# Patient Record
Sex: Female | Born: 1975 | Race: White | Hispanic: No | Smoking: Current every day smoker
Health system: Southern US, Community
[De-identification: ages and names within clinical notes are randomized; demographics above are authoritative.]

## PROBLEM LIST (undated history)

## (undated) DIAGNOSIS — M542 Cervicalgia: Secondary | ICD-10-CM

## (undated) DIAGNOSIS — J40 Bronchitis, not specified as acute or chronic: Secondary | ICD-10-CM

## (undated) DIAGNOSIS — J45909 Unspecified asthma, uncomplicated: Secondary | ICD-10-CM

## (undated) DIAGNOSIS — M549 Dorsalgia, unspecified: Secondary | ICD-10-CM

## (undated) DIAGNOSIS — G8929 Other chronic pain: Secondary | ICD-10-CM

## (undated) DIAGNOSIS — S92919A Unspecified fracture of unspecified toe(s), initial encounter for closed fracture: Secondary | ICD-10-CM

## (undated) DIAGNOSIS — J189 Pneumonia, unspecified organism: Secondary | ICD-10-CM

## (undated) HISTORY — PX: DILATION AND CURETTAGE, DIAGNOSTIC / THERAPEUTIC: SUR384

## (undated) HISTORY — PX: TUBAL LIGATION: SHX77

---

## 2011-12-26 ENCOUNTER — Encounter (HOSPITAL_BASED_OUTPATIENT_CLINIC_OR_DEPARTMENT_OTHER): Payer: Self-pay | Admitting: *Deleted

## 2011-12-26 ENCOUNTER — Emergency Department (HOSPITAL_BASED_OUTPATIENT_CLINIC_OR_DEPARTMENT_OTHER)
Admission: EM | Admit: 2011-12-26 | Discharge: 2011-12-26 | Disposition: A | Discharge status: Home / Self Care | Payer: Medicare Other | Attending: Emergency Medicine | Admitting: Emergency Medicine

## 2011-12-26 DIAGNOSIS — J45909 Unspecified asthma, uncomplicated: Secondary | ICD-10-CM | POA: Insufficient documentation

## 2011-12-26 DIAGNOSIS — G8929 Other chronic pain: Secondary | ICD-10-CM

## 2011-12-26 DIAGNOSIS — F17200 Nicotine dependence, unspecified, uncomplicated: Secondary | ICD-10-CM | POA: Insufficient documentation

## 2011-12-26 DIAGNOSIS — M542 Cervicalgia: Secondary | ICD-10-CM | POA: Insufficient documentation

## 2011-12-26 DIAGNOSIS — M545 Low back pain, unspecified: Secondary | ICD-10-CM | POA: Insufficient documentation

## 2011-12-26 HISTORY — DX: Other chronic pain: G89.29

## 2011-12-26 HISTORY — DX: Dorsalgia, unspecified: M54.9

## 2011-12-26 HISTORY — DX: Cervicalgia: M54.2

## 2011-12-26 MED ORDER — HYDROCODONE-ACETAMINOPHEN 5-500 MG PO TABS: 1.0000 | ORAL_TABLET | Freq: Four times a day (QID) | ORAL | Status: DC | PRN
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
## 2011-12-26 NOTE — Discharge Instructions (Signed)
Chronic Back Pain When back pain lasts longer than 3 months, it is called chronic back pain.This pain can be frustrating, but the cause of the pain is rarely dangerous.People with chronic back pain often go through certain periods that are more intense (flare-ups). CAUSES Chronic back pain can be caused by wear and tear (degeneration) on different structures in your back. These structures may include bones, ligaments, or discs. This degeneration may result in more pressure being placed on the nerves that travel to your legs and feet. This can lead to pain traveling from the low back down the back of the legs. When pain lasts longer than 3 months, it is not unusual for people to experience anxiety or depression. Anxiety and depression can also contribute to low back pain. TREATMENT  Establish a regular exercise plan. This is critical to improving your functional level.   Have a self-management plan for when you flare-up. Flare-ups rarely require a medical visit. Regular exercise will help reduce the intensity and frequency of your flare-ups.   Manage how you feel about your back pain and the rest of your life. Anxiety, depression, and feeling that you cannot alter your back pain have been shown to make back pain more intense and debilitating.   Medicines should never be your only treatment. They should be used along with other treatments to help you return to a more active lifestyle.   Procedures such as injections or surgery may be helpful but are rarely necessary. You may be able to get the same results with physical therapy or chiropractic care.  HOME CARE INSTRUCTIONS  Avoid bending, heavy lifting, prolonged sitting, and activities which make the problem worse.   Continue normal activity as much as possible.   Take brief periods of rest throughout the day to reduce your pain during flare-ups.   Follow your back exercise rehabilitation program. This can help reduce symptoms and prevent  more pain.   Only take over-the-counter or prescription medicines as directed by your caregiver. Muscle relaxants are sometimes prescribed. Narcotic pain medicine is discouraged for long-term pain, since addiction is a possible outcome.   If you smoke, quit.   Eat healthy foods and maintain a recommended body weight.  SEEK IMMEDIATE MEDICAL CARE IF:   You have weakness or numbness in one of your legs or feet.   You have trouble controlling your bladder or bowels.   You develop nausea, vomiting, abdominal pain, shortness of breath, or fainting.  Document Released: 11/01/2004 Document Revised: 09/13/2011 Document Reviewed: 09/08/2011 [employer] Patient Information 2012 [employer]. 
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
## 2011-12-27 NOTE — ED Provider Notes (Signed)
Medical screening examination/treatment/procedure(s) were performed by non-physician practitioner and as supervising physician I was immediately available for consultation/collaboration.    Nelia Shi, MD 12/27/11 [phone]

## 2012-01-03 ENCOUNTER — Encounter (HOSPITAL_COMMUNITY): Payer: Self-pay

## 2012-01-03 ENCOUNTER — Emergency Department (HOSPITAL_COMMUNITY): Payer: Medicare Other

## 2012-01-03 ENCOUNTER — Emergency Department (HOSPITAL_COMMUNITY)
Admission: EM | Admit: 2012-01-03 | Discharge: 2012-01-04 | Disposition: A | Discharge status: Home / Self Care | Payer: Medicare Other | Attending: Emergency Medicine | Admitting: Emergency Medicine

## 2012-01-03 DIAGNOSIS — R42 Dizziness and giddiness: Secondary | ICD-10-CM | POA: Insufficient documentation

## 2012-01-03 DIAGNOSIS — R29898 Other symptoms and signs involving the musculoskeletal system: Secondary | ICD-10-CM | POA: Insufficient documentation

## 2012-01-03 DIAGNOSIS — S0990XA Unspecified injury of head, initial encounter: Secondary | ICD-10-CM

## 2012-01-03 DIAGNOSIS — F17200 Nicotine dependence, unspecified, uncomplicated: Secondary | ICD-10-CM | POA: Insufficient documentation

## 2012-01-03 DIAGNOSIS — M79609 Pain in unspecified limb: Secondary | ICD-10-CM | POA: Insufficient documentation

## 2012-01-03 DIAGNOSIS — R110 Nausea: Secondary | ICD-10-CM | POA: Insufficient documentation

## 2012-01-03 DIAGNOSIS — W1809XA Striking against other object with subsequent fall, initial encounter: Secondary | ICD-10-CM | POA: Insufficient documentation

## 2012-01-03 DIAGNOSIS — R51 Headache: Secondary | ICD-10-CM | POA: Insufficient documentation

## 2012-01-03 DIAGNOSIS — M542 Cervicalgia: Secondary | ICD-10-CM

## 2012-01-03 DIAGNOSIS — J45909 Unspecified asthma, uncomplicated: Secondary | ICD-10-CM | POA: Insufficient documentation

## 2012-01-03 DIAGNOSIS — G8929 Other chronic pain: Secondary | ICD-10-CM | POA: Insufficient documentation

## 2012-01-03 DIAGNOSIS — M25519 Pain in unspecified shoulder: Secondary | ICD-10-CM | POA: Insufficient documentation

## 2012-01-03 DIAGNOSIS — M549 Dorsalgia, unspecified: Secondary | ICD-10-CM | POA: Insufficient documentation

## 2012-01-03 LAB — DIFFERENTIAL
Basophils Relative: 0 % (ref 0–1)
Lymphocytes Relative: 51 % — ABNORMAL HIGH (ref 12–46)
Lymphs Abs: 3.2 10*3/uL (ref 0.7–4.0)
Monocytes Relative: 5 % (ref 3–12)
Neutro Abs: 2.5 10*3/uL (ref 1.7–7.7)
Neutrophils Relative %: 39 % — ABNORMAL LOW (ref 43–77)

## 2012-01-03 LAB — URINALYSIS, ROUTINE W REFLEX MICROSCOPIC
Bilirubin Urine: NEGATIVE
Glucose, UA: NEGATIVE mg/dL
Nitrite: NEGATIVE
Specific Gravity, Urine: 1.014 (ref 1.005–1.030)
pH: 7 (ref 5.0–8.0)

## 2012-01-03 LAB — BASIC METABOLIC PANEL
BUN: 7 mg/dL (ref 6–23)
CO2: 29 mEq/L (ref 19–32)
Chloride: 105 mEq/L (ref 96–112)
GFR calc Af Amer: 73 mL/min — ABNORMAL LOW (ref >90)
Glucose, Bld: 87 mg/dL (ref 70–99)
Potassium: 3.6 mEq/L (ref 3.5–5.1)

## 2012-01-03 LAB — CBC
HCT: 36.7 % (ref 36.0–46.0)
Hemoglobin: 11.7 g/dL — ABNORMAL LOW (ref 12.0–15.0)
RBC: 4.66 MIL/uL (ref 3.87–5.11)

## 2012-01-03 MED ORDER — OXYCODONE-ACETAMINOPHEN 5-325 MG PO TABS: 1.0000 | ORAL_TABLET | Freq: Once | ORAL | Status: AC

## 2012-01-03 MED ORDER — OXYCODONE-ACETAMINOPHEN 5-325 MG PO TABS
1.0000 | ORAL_TABLET | Freq: Once | ORAL | Status: AC
  Administered 2012-01-03: 1 via ORAL
  Filled 2012-01-03: qty 1

## 2012-01-03 MED ORDER — ONDANSETRON HCL 4 MG PO TABS: 4.0000 mg | ORAL_TABLET | Freq: Four times a day (QID) | ORAL | Status: AC
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
## 2012-01-03 NOTE — Discharge Instructions (Signed)
Your xrays and labs were normal today. Please follow up with your primary care doctor for a recheck. Return to the ER for worsening pain, nausea/vomiting, or other worrisome symptoms.  Cervical Strain and Sprain (Whiplash) with Rehab Cervical strain and sprains are injuries that commonly occur with "whiplash" injuries. Whiplash occurs when the neck is forcefully whipped backward or forward, such as during a motor vehicle accident. The muscles, ligaments, tendons, discs and nerves of the neck are susceptible to injury when this occurs. SYMPTOMS   Pain or stiffness in the front and/or back of neck   Symptoms may present immediately or up to 24 hours after injury.   Dizziness, headache, nausea and vomiting.   Muscle spasm with soreness and stiffness in the neck.   Tenderness and swelling at the injury site.  CAUSES  Whiplash injuries often occur during contact sports or motor vehicle accidents.  RISK INCREASES WITH:  Osteoarthritis of the spine.   Situations that make head or neck accidents or trauma more likely.   High-risk sports (football, rugby, wrestling, hockey, auto racing, gymnastics, diving, contact karate or boxing).   Poor strength and flexibility of the neck.   Previous neck injury.   Poor tackling technique.   Improperly fitted or padded equipment.  PREVENTION  Learn and use proper technique (avoid tackling with the head, spearing and head-butting; use proper falling techniques to avoid landing on the head).   Warm up and stretch properly before activity.   Maintain physical fitness:   Strength, flexibility and endurance.   Cardiovascular fitness.   Wear properly fitted and padded protective equipment, such as padded soft collars, for participation in contact sports.  PROGNOSIS  Recovery for cervical strain and sprain injuries is dependent on the extent of the injury. These injuries are usually curable in 1 week to 3 months with appropriate treatment.    RELATED COMPLICATIONS   Temporary numbness and weakness may occur if the nerve roots are damaged, and this may persist until the nerve has completely healed.   Chronic pain due to frequent recurrence of symptoms.   Prolonged healing, especially if activity is resumed too soon (before complete recovery).  TREATMENT  Treatment initially involves the use of ice and medication to help reduce pain and inflammation. It is also important to perform strengthening and stretching exercises and modify activities that worsen symptoms so the injury does not get worse. These exercises may be performed at home or with a therapist. For patients who experience severe symptoms, a soft padded collar may be recommended to be worn around the neck.  Improving your posture may help reduce symptoms. Posture improvement includes pulling your chin and abdomen in while sitting or standing. If you are sitting, sit in a firm chair with your buttocks against the back of the chair. While sleeping, try replacing your pillow with a small towel rolled to 2 inches in diameter, or use a cervical pillow or soft cervical collar. Poor sleeping positions delay healing.  For patients with nerve root damage, which causes numbness or weakness, the use of a cervical traction apparatus may be recommended. Surgery is rarely necessary for these injuries. However, cervical strain and sprains that are present at birth (congenital) may require surgery. MEDICATION   If pain medication is necessary, nonsteroidal anti-inflammatory medications, such as aspirin and ibuprofen, or other minor pain relievers, such as acetaminophen, are often recommended.   Do not take pain medication for 7 days before surgery.   Prescription pain relievers may be given  if deemed necessary by your caregiver. Use only as directed and only as much as you need.  HEAT AND COLD:   Cold treatment (icing) relieves pain and reduces inflammation. Cold treatment should be  applied for 10 to 15 minutes every 2 to 3 hours for inflammation and pain and immediately after any activity that aggravates your symptoms. Use ice packs or an ice massage.   Heat treatment may be used prior to performing the stretching and strengthening activities prescribed by your caregiver, physical therapist, or athletic trainer. Use a heat pack or a warm soak.  SEEK MEDICAL CARE IF:   Symptoms get worse or do not improve in 2 weeks despite treatment.   New, unexplained symptoms develop (drugs used in treatment may produce side effects).  EXERCISES RANGE OF MOTION (ROM) AND STRETCHING EXERCISES - Cervical Strain and Sprain These exercises may help you when beginning to rehabilitate your injury. In order to successfully resolve your symptoms, you must improve your posture. These exercises are designed to help reduce the forward-head and rounded-shoulder posture which contributes to this condition. Your symptoms may resolve with or without further involvement from your physician, physical therapist or athletic trainer. While completing these exercises, remember:   Restoring tissue flexibility helps normal motion to return to the joints. This allows healthier, less painful movement and activity.   An effective stretch should be held for at least 20 seconds, although you may need to begin with shorter hold times for comfort.   A stretch should never be painful. You should only feel a gentle lengthening or release in the stretched tissue.  STRETCH- Axial Extensors  Lie on your back on the floor. You may bend your knees for comfort. Place a rolled up hand towel or dish towel, about 2 inches in diameter, under the part of your head that makes contact with the floor.   Gently tuck your chin, as if trying to make a "double chin," until you feel a gentle stretch at the base of your head.   Hold __________ seconds.  Repeat __________ times. Complete this exercise __________ times per day.   STRETECH - Axial Extension   Stand or sit on a firm surface. Assume a good posture: chest up, shoulders drawn back, abdominal muscles slightly tense, knees unlocked (if standing) and feet hip width apart.   Slowly retract your chin so your head slides back and your chin slightly lowers.Continue to look straight ahead.   You should feel a gentle stretch in the back of your head. Be certain not to feel an aggressive stretch since this can cause headaches later.   Hold for __________ seconds.  Repeat __________ times. Complete this exercise __________ times per day. STRETCH - Cervical Side Bend   Stand or sit on a firm surface. Assume a good posture: chest up, shoulders drawn back, abdominal muscles slightly tense, knees unlocked (if standing) and feet hip width apart.   Without letting your nose or shoulders move, slowly tip your right / left ear to your shoulder until your feel a gentle stretch in the muscles on the opposite side of your neck.   Hold __________ seconds.  Repeat __________ times. Complete this exercise __________ times per day. STRETCH - Cervical Rotators   Stand or sit on a firm surface. Assume a good posture: chest up, shoulders drawn back, abdominal muscles slightly tense, knees unlocked (if standing) and feet hip width apart.   Keeping your eyes level with the ground, slowly turn your head until  you feel a gentle stretch along the back and opposite side of your neck.   Hold __________ seconds.  Repeat __________ times. Complete this exercise __________ times per day. RANGE OF MOTION - Neck Circles   Stand or sit on a firm surface. Assume a good posture: chest up, shoulders drawn back, abdominal muscles slightly tense, knees unlocked (if standing) and feet hip width apart.   Gently roll your head down and around from the back of one shoulder to the back of the other. The motion should never be forced or painful.   Repeat the motion 10-20 times, or until you feel  the neck muscles relax and loosen.  Repeat __________ times. Complete the exercise __________ times per day. STRENGTHENING EXERCISES - Cervical Strain and Sprain These exercises may help you when beginning to rehabilitate your injury. They may resolve your symptoms with or without further involvement from your physician, physical therapist or athletic trainer. While completing these exercises, remember:   Muscles can gain both the endurance and the strength needed for everyday activities through controlled exercises.   Complete these exercises as instructed by your physician, physical therapist or athletic trainer. Progress the resistance and repetitions only as guided.   You may experience muscle soreness or fatigue, but the pain or discomfort you are trying to eliminate should never worsen during these exercises. If this pain does worsen, stop and make certain you are following the directions exactly. If the pain is still present after adjustments, discontinue the exercise until you can discuss the trouble with your clinician.  STRENGTH - Cervical Flexors, Isometric  Face a wall, standing about 6 inches away. Place a small pillow, a ball about 6-8 inches in diameter, or a folded towel between your forehead and the wall.   Slightly tuck your chin and gently push your forehead into the soft object. Push only with mild to moderate intensity, building up tension gradually. Keep your jaw and forehead relaxed.   Hold 10 to 20 seconds. Keep your breathing relaxed.   Release the tension slowly. Relax your neck muscles completely before you start the next repetition.  Repeat __________ times. Complete this exercise __________ times per day. STRENGTH- Cervical Lateral Flexors, Isometric   Stand about 6 inches away from a wall. Place a small pillow, a ball about 6-8 inches in diameter, or a folded towel between the side of your head and the wall.   Slightly tuck your chin and gently tilt your head  into the soft object. Push only with mild to moderate intensity, building up tension gradually. Keep your jaw and forehead relaxed.   Hold 10 to 20 seconds. Keep your breathing relaxed.   Release the tension slowly. Relax your neck muscles completely before you start the next repetition.  Repeat __________ times. Complete this exercise __________ times per day. STRENGTH - Cervical Extensors, Isometric   Stand about 6 inches away from a wall. Place a small pillow, a ball about 6-8 inches in diameter, or a folded towel between the back of your head and the wall.   Slightly tuck your chin and gently tilt your head back into the soft object. Push only with mild to moderate intensity, building up tension gradually. Keep your jaw and forehead relaxed.   Hold 10 to 20 seconds. Keep your breathing relaxed.   Release the tension slowly. Relax your neck muscles completely before you start the next repetition.  Repeat __________ times. Complete this exercise __________ times per day. POSTURE AND BODY MECHANICS  CONSIDERATIONS - Cervical Strain and Sprain Keeping correct posture when sitting, standing or completing your activities will reduce the stress put on different body tissues, allowing injured tissues a chance to heal and limiting painful experiences. The following are general guidelines for improved posture. Your physician or physical therapist will provide you with any instructions specific to your needs. While reading these guidelines, remember:  The exercises prescribed by your provider will help you have the flexibility and strength to maintain correct postures.   The correct posture provides the optimal environment for your joints to work. All of your joints have less wear and tear when properly supported by a spine with good posture. This means you will experience a healthier, less painful body.   Correct posture must be practiced with all of your activities, especially prolonged sitting and  standing. Correct posture is as important when doing repetitive low-stress activities (typing) as it is when doing a single heavy-load activity (lifting).  PROLONGED STANDING WHILE SLIGHTLY LEANING FORWARD When completing a task that requires you to lean forward while standing in one place for a long time, place either foot up on a stationary 2-4 inch high object to help maintain the best posture. When both feet are on the ground, the low back tends to lose its slight inward curve. If this curve flattens (or becomes too large), then the back and your other joints will experience too much stress, fatigue more quickly and can cause pain.  RESTING POSITIONS Consider which positions are most painful for you when choosing a resting position. If you have pain with flexion-based activities (sitting, bending, stooping, squatting), choose a position that allows you to rest in a less flexed posture. You would want to avoid curling into a fetal position on your side. If your pain worsens with extension-based activities (prolonged standing, working overhead), avoid resting in an extended position such as sleeping on your stomach. Most people will find more comfort when they rest with their spine in a more neutral position, neither too rounded nor too arched. Lying on a non-sagging bed on your side with a pillow between your knees, or on your back with a pillow under your knees will often provide some relief. Keep in mind, being in any one position for a prolonged period of time, no matter how correct your posture, can still lead to stiffness. WALKING Walk with an upright posture. Your ears, shoulders and hips should all line-up. OFFICE WORK When working at a desk, create an environment that supports good, upright posture. Without extra support, muscles fatigue and lead to excessive strain on joints and other tissues. CHAIR:  A chair should be able to slide under your desk when your back makes contact with the back of  the chair. This allows you to work closely.   The chair's height should allow your eyes to be level with the upper part of your monitor and your hands to be slightly lower than your elbows.   Body position:   Your feet should make contact with the floor. If this is not possible, use a foot rest.   Keep your ears over your shoulders. This will reduce stress on your neck and low back.  Document Released: 09/24/2005 Document Revised: 09/13/2011 Document Reviewed: 01/06/2009 [facility] Patient Information 2012 [place], [place].Head Injury, Adult You have had a head injury that does not appear serious at this time. A concussion is a state of changed mental ability, usually from a blow to the head. You should take clear  liquids for the rest of the day and then resume your regular diet. You should not take sedatives or alcoholic beverages for as long as directed by your caregiver after discharge. After injuries such as yours, most problems occur within the first 24 hours. SYMPTOMS These minor symptoms may be experienced after discharge:  Memory difficulties.   Dizziness.   Headaches.   Double vision.   Hearing difficulties.   Depression.   Tiredness.   Weakness.   Difficulty with concentration.  If you experience any of these problems, you should not be alarmed. A concussion requires a few days for recovery. Many patients with head injuries frequently experience such symptoms. Usually, these problems disappear without medical care. If symptoms last for more than one day, notify your caregiver. See your caregiver sooner if symptoms are becoming worse rather than better. HOME CARE INSTRUCTIONS   During the next 24 hours you must stay with someone who can watch you for the warning signs listed below.  Although it is unlikely that serious side effects will occur, you should be aware of signs and symptoms which may necessitate your return to this location. Side effects may occur up to 7 - 10  days following the injury. It is important for you to carefully monitor your condition and contact your caregiver or seek immediate medical attention if there is a change in your condition. SEEK IMMEDIATE MEDICAL CARE IF:   There is confusion or drowsiness.   You can not awaken the injured person.   There is nausea (feeling sick to your stomach) or continued, forceful vomiting.   You notice dizziness or unsteadiness which is getting worse, or inability to walk.   You have convulsions or unconsciousness.   You experience severe, persistent headaches not relieved by over-the-counter or prescription medicines for pain. (Do not take aspirin as this impairs clotting abilities). Take other pain medications only as directed.   You can not use arms or legs normally.   There is clear or bloody discharge from the nose or ears.  MAKE SURE YOU:   Understand these instructions.   Will watch your condition.   Will get help right away if you are not doing well or get worse.  Document Released: 09/24/2005 Document Revised: 09/13/2011 Document Reviewed: 08/12/2009 [facility] Patient Information 2012 [place], [place].

## 2012-01-03 NOTE — ED Provider Notes (Signed)
History     CSN: [id number]  Arrival date & time 01/03/12  1928   First MD Initiated Contact with Patient 01/03/12 2115      Chief Complaint  Patient presents with  . Fall  . Neck Pain    (Consider location/radiation/quality/duration/timing/severity/associated sxs/prior treatment) HPI Hx from pt. 36yo F with hx chronic back, neck pain presents with pain to her neck. States that she felt faint on Monday while in her bedroom and fell and hit the back of her head from a standing position. Does not recall if she had any sensation of the room closing in around her, tingling, warmth. Unsure if she actually suffered LOC or not; there were not any witnesses. States she has felt lightheaded and nauseated intermittently since then; has had lightheadedness over the past week, predating the fall. No falls since. Has not had any associated CP, SOB, palpitations with these episodes. Has had residual pain to post neck since that time radiating to R shoulder without any muscle weakness. Has been eating, drinking as normal.  LMP early March. Has had tubal ligation.  Past Medical History  Diagnosis Date  . Chronic back pain   . Chronic neck pain   . Asthma     Past Surgical History  Procedure Date  . Cesarean section     x 4  . Tubal ligation     History reviewed. No pertinent family history.  History  Substance Use Topics  . Smoking status: Current Everyday Smoker -- 1.0 packs/day for 15 years    Types: Cigarettes  . Smokeless tobacco: Not on file  . Alcohol Use: No    OB History    Grav Para Term Preterm Abortions TAB SAB Ect Mult Living                  Review of Systems  Constitutional: Negative for fever, chills, activity change and appetite change.  HENT: Positive for neck pain. Negative for ear pain, congestion, sore throat, trouble swallowing and neck stiffness.   Eyes: Negative for photophobia and visual disturbance.  Respiratory: Negative for chest tightness and  shortness of breath.   Cardiovascular: Negative for chest pain and palpitations.  Gastrointestinal: Positive for nausea. Negative for vomiting, abdominal pain and diarrhea.  Musculoskeletal: Negative for myalgias.  Skin: Negative for color change and rash.  Neurological: Positive for light-headedness. Negative for headaches.    Allergies  Macrolides and ketolides; Naproxen; Toradol; Tramadol; and Ultracet  Home Medications   Current Outpatient Rx  Name Route Sig Dispense Refill  . ALBUTEROL SULFATE (2.5 MG/3ML) 0.083% IN NEBU Nebulization Take 2.5 mg by nebulization every 6 (six) hours as needed. For shortness of breath.    Marlin Canary HEADACHE PO Oral Take 1 packet by mouth 4 (four) times daily as needed. For pain.    Marland Kitchen OMEGA-3 FATTY ACIDS 1000 MG PO CAPS Oral Take 1 g by mouth 2 (two) times daily.      BP 125/84  Pulse 82  Temp(Src) 97.9 F (36.6 C) (Oral)  Resp 18  SpO2 100%  LMP 12/12/2011  Physical Exam  Nursing note and vitals reviewed. Constitutional: She is oriented to person, place, and time. She appears well-developed and well-nourished. No distress.  HENT:  Head: Normocephalic and atraumatic.  Right Ear: External ear normal.  Left Ear: External ear normal.  Eyes: EOM are normal. Pupils are equal, round, and reactive to light.  Neck: Normal range of motion.       Spine: No  palpable stepoff, crepitus, or gross deformity appreciated. Midline tenderness over cervical spine. No appreciable spasm of paravertebral muscles.   Cardiovascular: Normal rate, regular rhythm and normal heart sounds.   Pulmonary/Chest: Effort normal and breath sounds normal.  Abdominal: Soft. Bowel sounds are normal. There is no tenderness. There is no rebound.  Musculoskeletal: Normal range of motion.  Neurological: She is alert and oriented to person, place, and time. No cranial nerve deficit or sensory deficit. She exhibits normal muscle tone. Coordination normal. GCS eye subscore is 4. GCS  verbal subscore is 5. GCS motor subscore is 6.       coordination as assessed with finger to nose and rapid alternating movements normal; no notable muscle weakness on strength testing  Skin: Skin is warm and dry. She is not diaphoretic.  Psychiatric: She has a normal mood and affect.    ED Course  Procedures (including critical care time)  Labs Reviewed  CBC - Abnormal; Notable for the following:    Hemoglobin 11.7 (*)    MCH 25.1 (*)    All other components within normal limits  DIFFERENTIAL - Abnormal; Notable for the following:    Neutrophils Relative 39 (*)    Lymphocytes Relative 51 (*)    All other components within normal limits  BASIC METABOLIC PANEL - Abnormal; Notable for the following:    Creatinine, Ser 1.13 (*)    GFR calc non Af Amer 63 (*)    GFR calc Af Amer 73 (*)    All other components within normal limits  URINALYSIS, ROUTINE W REFLEX MICROSCOPIC - Abnormal; Notable for the following:    APPearance CLOUDY (*)    All other components within normal limits  POCT PREGNANCY, URINE  LAB REPORT - SCANNED   Ct Head Wo Contrast  01/03/2012  *RADIOLOGY REPORT*  Clinical Data:  Status post fall 3 days ago, hitting head and neck. Headaches, dizziness, and neck pain, radiating to the right arm.  CT HEAD WITHOUT CONTRAST AND CT CERVICAL SPINE WITHOUT CONTRAST  Technique:  Multidetector CT imaging of the head and cervical spine was performed following the standard protocol without intravenous contrast.  Multiplanar CT image reconstructions of the cervical spine were also generated.  Comparison: Cervical spine radiographs performed 04/06/2010  CT HEAD  Findings: There is no evidence of acute infarction, mass lesion, or intra- or extra-axial hemorrhage on CT.  The posterior fossa, including the cerebellum, brainstem and fourth ventricle, is within normal limits.  The third and lateral ventricles, and basal ganglia are unremarkable in appearance.  The cerebral hemispheres are  symmetric in appearance, with normal gray- white differentiation.  No mass effect or midline shift is seen.  There is no evidence of fracture; visualized osseous structures are unremarkable in appearance.  The orbits are within normal limits. The paranasal sinuses and mastoid air cells are well-aerated.  No significant soft tissue abnormalities are seen.  IMPRESSION: No evidence of traumatic intracranial injury or fracture.  CT CERVICAL SPINE  Findings: There is no evidence of fracture or subluxation. Vertebral bodies demonstrate normal height and alignment. Intervertebral disc spaces are preserved.  Prevertebral soft tissues are within normal limits.  The visualized neural foramina are grossly unremarkable.  The thyroid gland is unremarkable in appearance.  The visualized lung apices are clear.  No significant soft tissue abnormalities are seen.  IMPRESSION: No evidence of fracture or subluxation along the cervical spine.  Original Report Authenticated By: Tonia Ghent, M.D.   Ct Cervical Spine Wo Contrast  01/03/2012  *RADIOLOGY REPORT*  Clinical Data:  Status post fall 3 days ago, hitting head and neck. Headaches, dizziness, and neck pain, radiating to the right arm.  CT HEAD WITHOUT CONTRAST AND CT CERVICAL SPINE WITHOUT CONTRAST  Technique:  Multidetector CT imaging of the head and cervical spine was performed following the standard protocol without intravenous contrast.  Multiplanar CT image reconstructions of the cervical spine were also generated.  Comparison: Cervical spine radiographs performed 04/06/2010  CT HEAD  Findings: There is no evidence of acute infarction, mass lesion, or intra- or extra-axial hemorrhage on CT.  The posterior fossa, including the cerebellum, brainstem and fourth ventricle, is within normal limits.  The third and lateral ventricles, and basal ganglia are unremarkable in appearance.  The cerebral hemispheres are symmetric in appearance, with normal gray- white differentiation.   No mass effect or midline shift is seen.  There is no evidence of fracture; visualized osseous structures are unremarkable in appearance.  The orbits are within normal limits. The paranasal sinuses and mastoid air cells are well-aerated.  No significant soft tissue abnormalities are seen.  IMPRESSION: No evidence of traumatic intracranial injury or fracture.  CT CERVICAL SPINE  Findings: There is no evidence of fracture or subluxation. Vertebral bodies demonstrate normal height and alignment. Intervertebral disc spaces are preserved.  Prevertebral soft tissues are within normal limits.  The visualized neural foramina are grossly unremarkable.  The thyroid gland is unremarkable in appearance.  The visualized lung apices are clear.  No significant soft tissue abnormalities are seen.  IMPRESSION: No evidence of fracture or subluxation along the cervical spine.  Original Report Authenticated By: Tonia Ghent, M.D.     1. Minor head injury   2. Neck pain       MDM  Pt with neck pain after falling 3 days ago and intermittent lightheadedness which began before her fall. CT head/neck was performed which did not show any acute intracranial abnormalities or fx c-spine. Suspect this may be more ligamentous in nature. Basic screening labs were also performed which were unremarkable for electrolyte abnormality or other findings which may account for pt's sensation of lightheadedness. She was advised of these findings. I strongly encouraged her to make a f/u with her PCP for further eval and tx if her lightheadedness persists. Return precautions discussed.        Grant Fontana, [place] 01/04/12 1320

## 2012-01-04 NOTE — ED Provider Notes (Signed)
Medical screening examination/treatment/procedure(s) were conducted as a shared visit with non-physician practitioner(s) and myself.  I personally evaluated the patient during the encounter   Kemba Hoppes, MD 01/04/12 2256 

## 2012-01-16 ENCOUNTER — Emergency Department (HOSPITAL_COMMUNITY): Payer: Medicare Other

## 2012-01-16 ENCOUNTER — Emergency Department (HOSPITAL_COMMUNITY): Admission: EM | Admit: 2012-01-16 | Discharge: 2012-01-16 | Discharge status: Against Medical Advice | Payer: Self-pay

## 2012-01-16 ENCOUNTER — Encounter (HOSPITAL_COMMUNITY): Payer: Self-pay | Admitting: *Deleted

## 2012-01-16 ENCOUNTER — Emergency Department (HOSPITAL_COMMUNITY)
Admission: EM | Admit: 2012-01-16 | Discharge: 2012-01-16 | Disposition: A | Discharge status: Home / Self Care | Payer: Medicare Other | Attending: Emergency Medicine | Admitting: Emergency Medicine

## 2012-01-16 DIAGNOSIS — W2209XA Striking against other stationary object, initial encounter: Secondary | ICD-10-CM | POA: Insufficient documentation

## 2012-01-16 DIAGNOSIS — IMO0001 Reserved for inherently not codable concepts without codable children: Secondary | ICD-10-CM

## 2012-01-16 DIAGNOSIS — J45909 Unspecified asthma, uncomplicated: Secondary | ICD-10-CM | POA: Insufficient documentation

## 2012-01-16 DIAGNOSIS — IMO0002 Reserved for concepts with insufficient information to code with codable children: Secondary | ICD-10-CM | POA: Insufficient documentation

## 2012-01-16 DIAGNOSIS — G8929 Other chronic pain: Secondary | ICD-10-CM | POA: Insufficient documentation

## 2012-01-16 MED ORDER — HYDROMORPHONE HCL PF 1 MG/ML IJ SOLN
1.0000 mg | Freq: Once | INTRAMUSCULAR | Status: AC
  Administered 2012-01-16: 1 mg via INTRAMUSCULAR
  Filled 2012-01-16: qty 1

## 2012-01-16 MED ORDER — ONDANSETRON 4 MG PO TBDP
8.0000 mg | ORAL_TABLET | Freq: Once | ORAL | Status: AC
  Administered 2012-01-16: 8 mg via ORAL
  Filled 2012-01-16: qty 2

## 2012-01-16 NOTE — ED Provider Notes (Signed)
Medical screening examination/treatment/procedure(s) were performed by non-physician practitioner and as supervising physician I was immediately available for consultation/collaboration.   Viktoria Gruetzmacher, MD 01/16/12 2350 

## 2012-01-16 NOTE — ED Provider Notes (Signed)
History     CSN: [id number]  Arrival date & time 01/16/12  2028   First MD Initiated Contact with Patient 01/16/12 2137      Chief Complaint  Patient presents with  . Foot Injury    (Consider location/radiation/quality/duration/timing/severity/associated sxs/prior treatment) HPI Comments: Note: 56 oxycodone 10-325 filled 01/10/12, 150 tabs narcotics filled 03/13.   Patient presents with L 5th toe pain after hitting a corner wall yesterday. She is a bit unclear as to how this happened. Denies other injuries from this event.  Also yesterday she injured R thumb on trunk of car. States she is able to bend thumb.   Patient states she has taken only Goody powder for her injuries. She also took a hydrocodone 5mg  that she got from a friend. She denies other pain medicine use. She is requesting a shot because 'the pill isn't working'.  When asked about recent narcotics being filled, patient states she was seeing a pain management doctor and had a pain contract, states her previous prescriptions were stolen, and someone called her pain doctor and told them she was selling her medications.     Patient is a 36 y.o. female presenting with foot injury.  Foot Injury  Pertinent negatives include no numbness.    Past Medical History  Diagnosis Date  . Chronic back pain   . Chronic neck pain   . Asthma     Past Surgical History  Procedure Date  . Cesarean section     x 4  . Tubal ligation     History reviewed. No pertinent family history.  History  Substance Use Topics  . Smoking status: Current Everyday Smoker -- 1.0 packs/day for 15 years    Types: Cigarettes  . Smokeless tobacco: Not on file  . Alcohol Use: No    OB History    Grav Para Term Preterm Abortions TAB SAB Ect Mult Living                  Review of Systems  Constitutional: Negative for activity change.  HENT: Negative for neck pain.   Musculoskeletal: Positive for arthralgias and gait problem. Negative for  back pain and joint swelling.  Skin: Negative for wound.  Neurological: Negative for weakness and numbness.    Allergies  Macrolides and ketolides; Naproxen; Toradol; Tramadol; and Ultracet  Home Medications   Current Outpatient Rx  Name Route Sig Dispense Refill  . ALBUTEROL SULFATE (2.5 MG/3ML) 0.083% IN NEBU Nebulization Take 2.5 mg by nebulization every 6 (six) hours as needed. For shortness of breath.    Marlin Canary HEADACHE PO Oral Take 1 packet by mouth 4 (four) times daily as needed. For pain.    Marland Kitchen OMEGA-3 FATTY ACIDS 1000 MG PO CAPS Oral Take 1 g by mouth 2 (two) times daily.      BP 121/77  Pulse 85  Temp 98.4 F (36.9 C)  Resp 18  SpO2 99%  LMP 12/12/2011  Physical Exam  Nursing note and vitals reviewed. Constitutional: She is oriented to person, place, and time. She appears well-developed and well-nourished.  HENT:  Head: Normocephalic and atraumatic.  Eyes: Pupils are equal, round, and reactive to light.  Neck: Normal range of motion. Neck supple.  Cardiovascular: Exam reveals no decreased pulses.   Pulses:      Dorsalis pedis pulses are 2+ on the right side, and 2+ on the left side.       Posterior tibial pulses are 2+ on the right side,  and 2+ on the left side.  Pulmonary/Chest: No respiratory distress.  Musculoskeletal: She exhibits tenderness. She exhibits no edema.       Mild edema and bruising, tenderness, over base of L proximal 5th metatarsal. Right thumb appears normal, full active ROM in all directions. Full active ROM in R wrist. No evidence of trauma.   Neurological: She is alert and oriented to person, place, and time. No sensory deficit.       Motor, sensation, and vascular distal to the injuries are fully intact.   Skin: Skin is warm and dry.  Psychiatric: She has a normal mood and affect.    ED Course  Procedures (including critical care time)  Labs Reviewed - No data to display Dg Foot Complete Left  01/16/2012  *RADIOLOGY REPORT*  Clinical  Data: Status post fall; left foot swelling and bruising.  LEFT FOOT - COMPLETE 3+ VIEW  Comparison: None.  Findings: There is apparent cortical irregularity along the proximal aspect of the fifth proximal phalanx, raising concern for a poorly characterized fracture; this may extend to the joint space.  The joint spaces are otherwise preserved.  There is no evidence of talar subluxation; the subtalar joint is unremarkable in appearance.  An os naviculare is noted.  A small plantar calcaneal spur is seen.  No significant soft tissue abnormalities are seen.  IMPRESSION:  1.  Minimally displaced fracture involving the proximal aspect of the fifth proximal phalanx, with question of intra-articular extension. 2.  No additional fractures seen. 3.  Os naviculare noted.  Original Report Authenticated By: Tonia Ghent, M.D.     1. Closed fracture of proximal phalanx     10:21 PM Patient seen and examined. X-ray reviewed by myself.   Vital signs reviewed and are as follows: Filed Vitals:   01/16/12 2037  BP: 121/77  Pulse: 85  Temp: 98.4 F (36.9 C)  Resp: 18   Will give pain medicine in ED due to fracture. Crutches and post-op shoe given by ortho tech. Patient counseled on risk protocol. Patient urged followup with PCP or orthopedic referral in one week.  Patient seen walking in emergency department with minimal difficulty.   MDM  Fracture of proximal fifth metatarsal. Crutches and postop shoe given. Orthopedic referral given. Concern for narcotic seeking behavior.       Renne Crigler, [place] 01/16/12 2227

## 2012-01-16 NOTE — ED Notes (Signed)
Pt states understanding of discharge instructions 

## 2012-01-16 NOTE — Progress Notes (Signed)
Orthopedic Tech Progress Note Patient Details:  Mariah Avila January 16, 1976 [id number]  Other Ortho Devices Type of Ortho Device: Postop boot;Crutches Ortho Device Location: left foot Ortho Device Interventions: Application   Nikki Dom 01/16/2012, 10:31 PM

## 2012-01-16 NOTE — ED Notes (Signed)
Pt states that shoe size is 10

## 2012-01-16 NOTE — ED Notes (Signed)
Besides foot injury pt also c/o of having her thumb smashed in car door.

## 2012-01-16 NOTE — Discharge Instructions (Signed)
Please read and follow all provided instructions.  Your diagnoses today include:  1. Closed fracture of proximal phalanx     Tests performed today include:  X-ray which shows possible toe fracture  Vital signs. See below for your results today.   Medications prescribed:   None  Home care instructions:  Follow any educational materials contained in this packet.  Follow-up instructions: Please follow-up with your primary care provider or the orthopedic referral in one week. This is to ensure that your fracture heals properly.  If you do not have a primary care doctor -- see below for referral information.   Return instructions:   Please return to the Emergency Department if you experience worsening symptoms.   Please return if you have any other emergent concerns.  Additional Information:  Your vital signs today were: BP 121/77  Pulse 85  Temp 98.4 F (36.9 C)  Resp 18  SpO2 99%  LMP 12/12/2011 If your blood pressure (BP) was elevated above 135/85 this visit, please have this repeated by your doctor within one month. -------------- No Primary Care Doctor Call [facility]  [phone] Other agencies that provide inexpensive medical care    [facility]  [phone]    [facility]  [phone]    [facility]  [phone]    [facility]  [phone]    [facility]  [phone]    [facility]  [phone] -------------- RESOURCE GUIDE:  Dental Problems  Patients with Medicaid: [facility] [phone] [address]                                            [phone] Phone:  [phone]                                                   Phone:  [phone]  If unable to pay or uninsured, contact:  [facility] or [facility]. to become qualified for the adult dental clinic.  Chronic Pain Problems Contact [facility]   [phone] Patients need to be referred by their primary care doctor.  Insufficient Money for Medicine Contact [employer]:  call "211" or [facility] [phone])221-3251.  Psychological Services [facility]  [phone] [facility]  [phone] [facility]   [phone] (emergency services [phone])  Substance Abuse Resources [facility]  [phone] [facility] [phone] [facility] [phone] Floydene Flock [phone] Residential & Outpatient Substance Abuse Program  [phone]  Abuse/Neglect [facility] Child Abuse Hotline 262-584-9862 [facility] Child Abuse Hotline [phone] (After Hours)  Emergency Shelter [facility] [phone]  Maternity Homes [facility] [place] [facility] [place] [phone] [facility] [phone]  [facility] Resources  [facility]     [employer]                          [facility] Dept. [address] [place]  [address]      [address]  Blondell Reveal Phone:  [phone]                                   Phone:  [phone]                 Phone:  [phone]  [facility] Phone:  [phone]  [facility] Child Abuse Hotline 867-151-8639 [phone] (After Hours)

## 2012-01-16 NOTE — ED Notes (Signed)
Pt reports having (L) foot injury.  pts pinky toe is bruised and swollen.  Pt reports pain with movement.  Pt has sensation, movement and cap refill.  Pt also reports (R) thumb pain after slamming it in the trunk of a car.

## 2012-01-21 ENCOUNTER — Encounter (HOSPITAL_COMMUNITY): Payer: Self-pay | Admitting: *Deleted

## 2012-01-21 ENCOUNTER — Emergency Department (INDEPENDENT_AMBULATORY_CARE_PROVIDER_SITE_OTHER)
Admission: EM | Admit: 2012-01-21 | Discharge: 2012-01-21 | Disposition: A | Discharge status: Home / Self Care | Payer: Medicare Other | Source: Home / Self Care | Attending: Emergency Medicine | Admitting: Emergency Medicine

## 2012-01-21 DIAGNOSIS — S92919A Unspecified fracture of unspecified toe(s), initial encounter for closed fracture: Secondary | ICD-10-CM

## 2012-01-21 DIAGNOSIS — J40 Bronchitis, not specified as acute or chronic: Secondary | ICD-10-CM

## 2012-01-21 HISTORY — DX: Bronchitis, not specified as acute or chronic: J40

## 2012-01-21 HISTORY — DX: Unspecified fracture of unspecified toe(s), initial encounter for closed fracture: S92.919A

## 2012-01-21 HISTORY — DX: Pneumonia, unspecified organism: J18.9

## 2012-01-21 MED ORDER — DEXAMETHASONE 4 MG PO TABS: 8.0000 mg | ORAL_TABLET | Freq: Every day | ORAL | Status: DC

## 2012-01-21 MED ORDER — DICLOFENAC SODIUM 1 % TD GEL
1.0000 | Freq: Four times a day (QID) | TRANSDERMAL | Status: AC
Start: 2012-01-21 — End: ?

## 2012-01-21 MED ORDER — DEXAMETHASONE 4 MG PO TABS: 4.0000 mg | ORAL_TABLET | Freq: Every day | ORAL | Status: AC

## 2012-01-21 MED ORDER — ALBUTEROL SULFATE HFA 108 (90 BASE) MCG/ACT IN AERS: 1.0000 | INHALATION_SPRAY | Freq: Four times a day (QID) | RESPIRATORY_TRACT | Status: AC | PRN

## 2012-01-21 NOTE — Discharge Instructions (Signed)
Ice, elevate your foot as much as possible. Where the stiff soled shoe and use the crutches. This will help with your foot pain. Take two puffs from your albuterol inhaler every 4 hours. Finish the steroids unless your doctor tells you to stop. Do a peak flow, once the morning and once at night. Write this down. The number should be going up, not down. You may decrease the frequency of your albuterol inhaler as the numbers go up and you start feeling better. You may start the steroids tomorrow, as you have already taken today's dose. You may take tylenol 1 gram up to 4 times a day as needed for pain. This with aspirin is an effective combination for pain and fever. Make sure you drink extra fluids. Return if you get worse, have a fever >100.4, or any other concerns.   Go to www.goodrx.com to look up your medications. This will give you a list of where you can find your prescriptions at the most affordable prices.

## 2012-01-21 NOTE — ED Notes (Signed)
Reports being diagnosed w/ pneumonia and bronchitis 2 wks ago - completed Z-pak approx 1 wk ago, but states she feels like she has not improved at all.  BBS clear.  Has been taking albuterol HFA 4-6x/day; also was taking albuterol nebs "but somebody stole it".  Also c/o pain to fractured left 4th & 5th toes (was seen at [facility] ED 3 days ago) - was told to f/u with ortho, but "waiting for referral to go through", and could not get appt w/ PCP for both issues until next Thurs.  States she needs something for pain for toes.  Has flip-flops on and not using crutches; when asked if she has been using post-op shoe and crutches, states "I try".  Has not been taking any OTC meds.
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
## 2013-04-08 IMAGING — CT CT HEAD W/O CM
4 series · 16 of 30 positions shown, 19 images · non-contrast
Comparison: Cervical spine radiographs performed 04/06/2010

CT HEAD

CLINICAL DATA: Status post fall 3 days ago, hitting head and neck.
Headaches, dizziness, and neck pain, radiating to the right arm.

CT HEAD WITHOUT CONTRAST AND CT CERVICAL SPINE WITHOUT CONTRAST
TECHNIQUE: Multidetector CT imaging of the head and cervical spine
was performed following the standard protocol without intravenous
contrast.  Multiplanar CT image reconstructions of the cervical
spine were also generated.

[Series 2: head w/o · axial · non-contrast · 0.48mm/px · z∈[+1010,+1060]mm · 2 of 30 slices shown]
[im 10/30  brain]
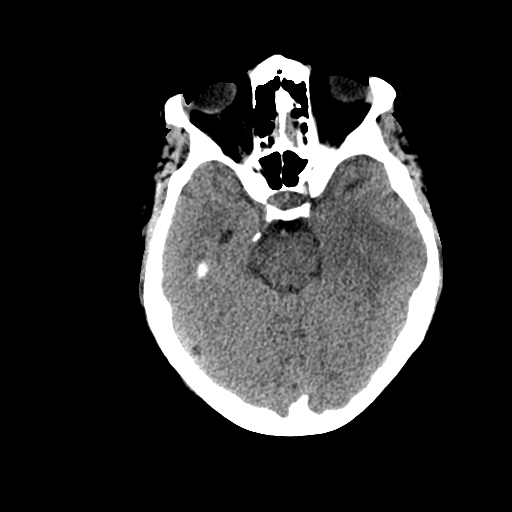
[im 20/30  brain]
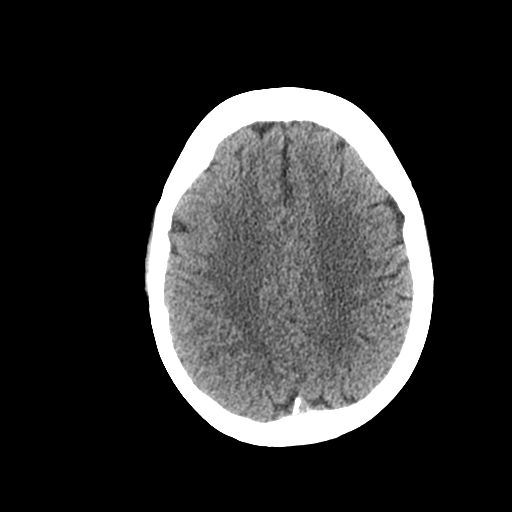

[Series 3: bone windows · axial · 0.48mm/px · z∈[+1010,+1060]mm · 2 of 30 slices shown]
[im 10/30  bone]
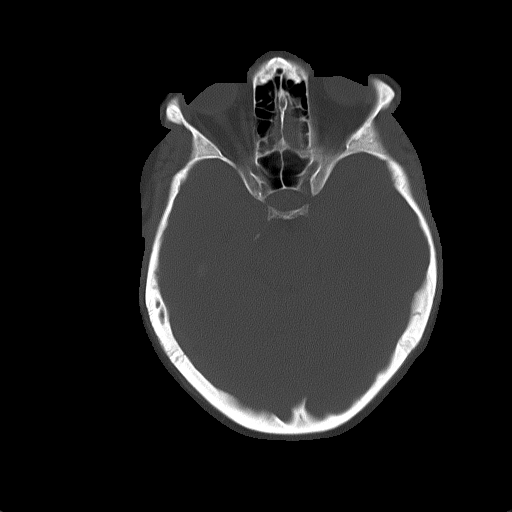
[im 20/30  bone]
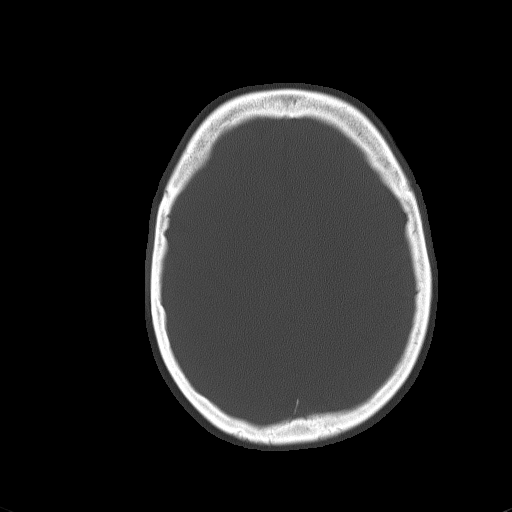

[Series 4: c-spine st · axial · 0.29mm/px · z∈[+812,+844]mm · 3 of 81 slices shown]
[im 9/81  brain]
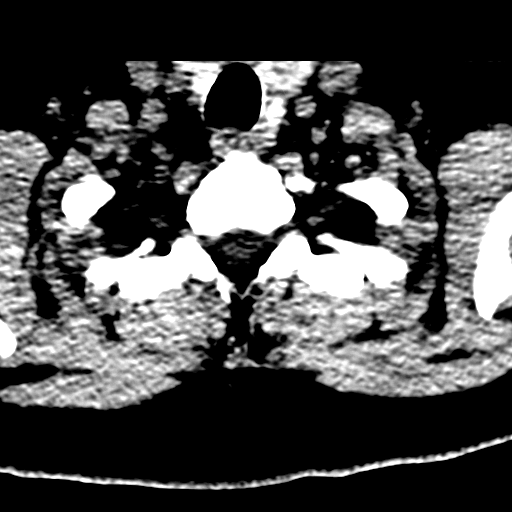
[im 17/81  brain]
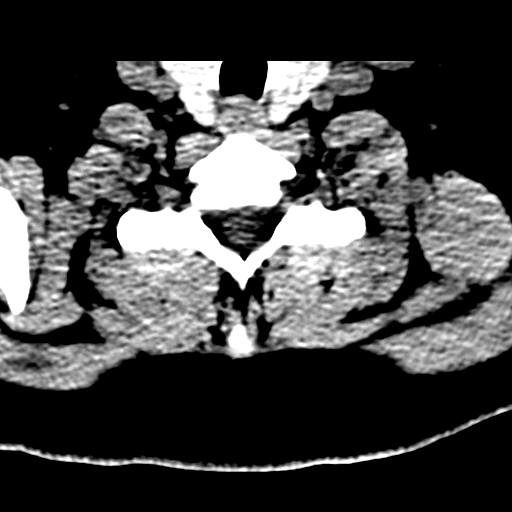
[im 25/81  brain]
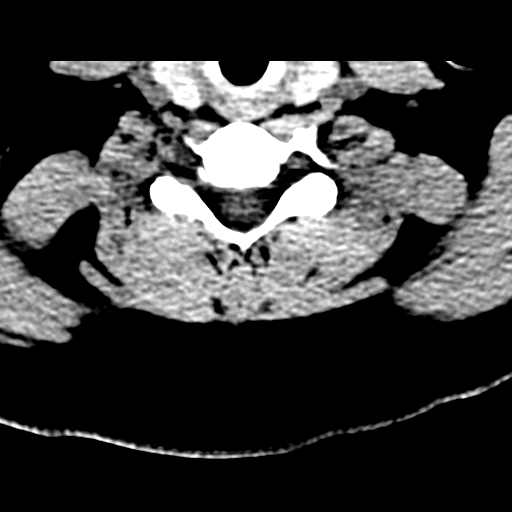

[Series 9: thins for (person_name) recon · axial · 0.23mm/px · z∈[+801,+926]mm · 9 of 81 slices shown, 12 images]
[im 9/81  brain]
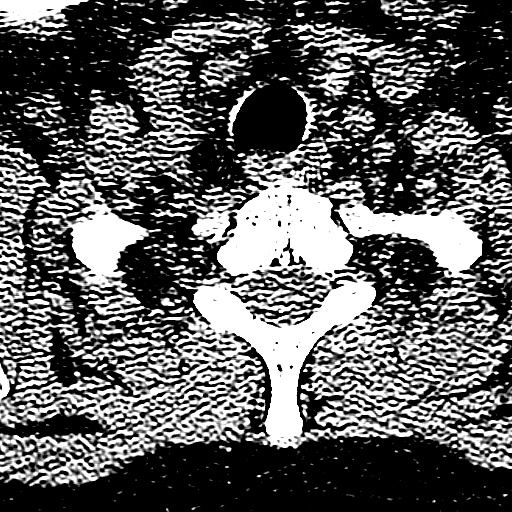
[im 9/81  bone]
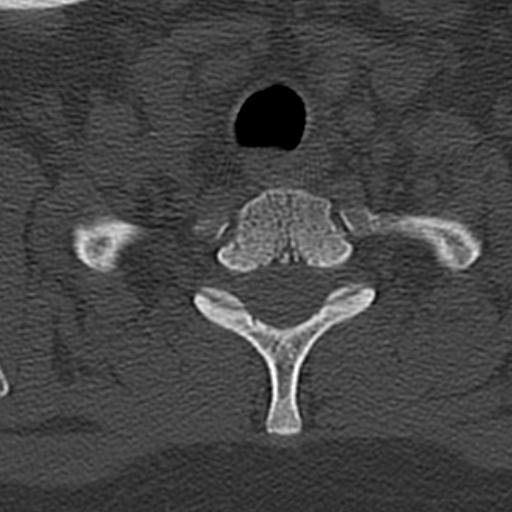
[im 17/81  brain]
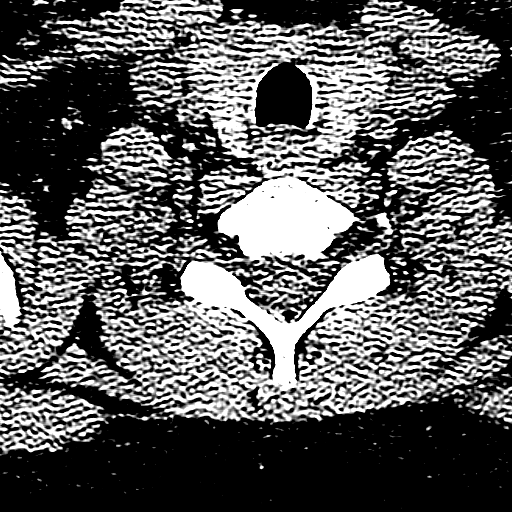
[im 25/81  brain]
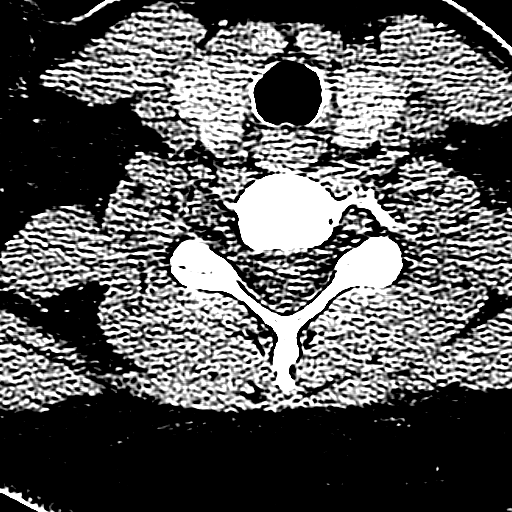
[im 33/81  brain]
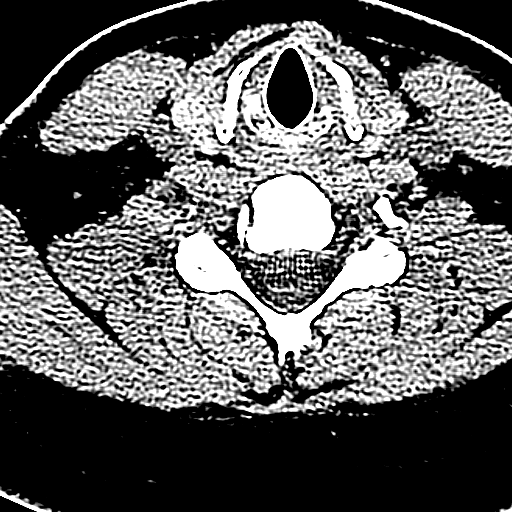
[im 41/81  brain]
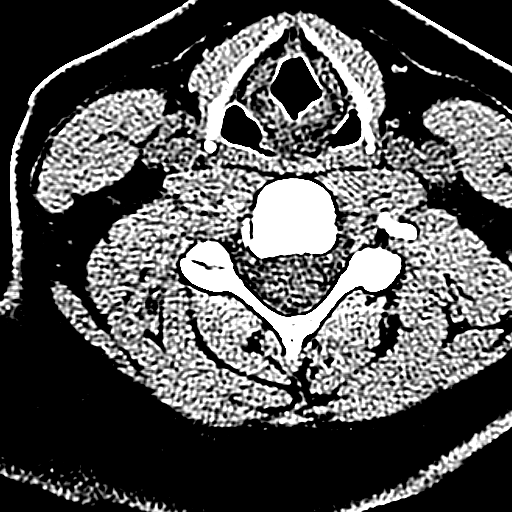
[im 41/81  bone]
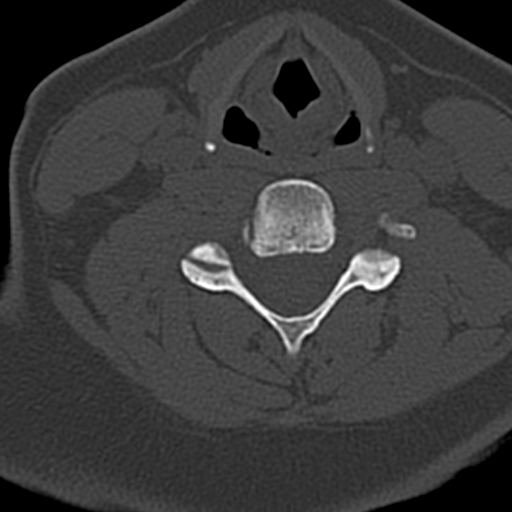
[im 49/81  brain]
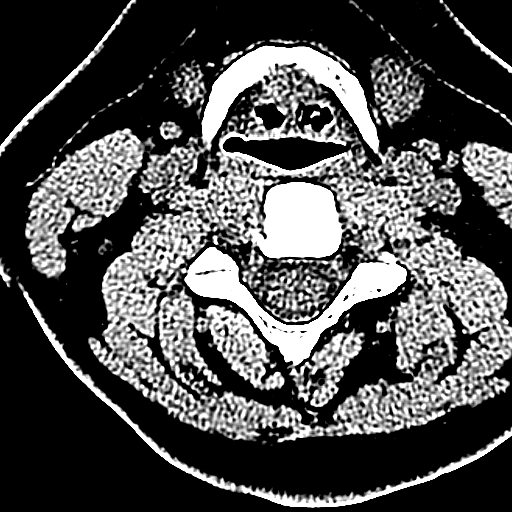
[im 57/81  brain]
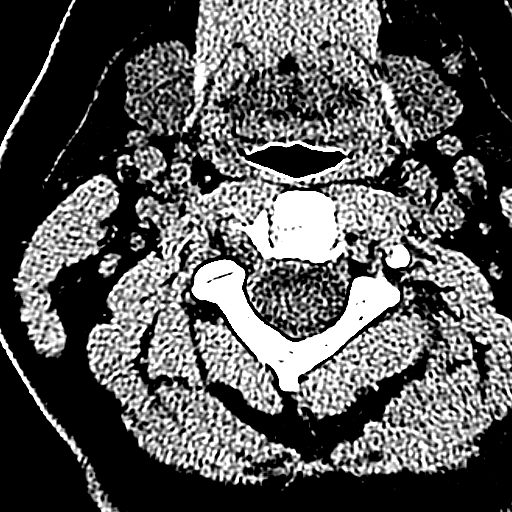
[im 65/81  brain]
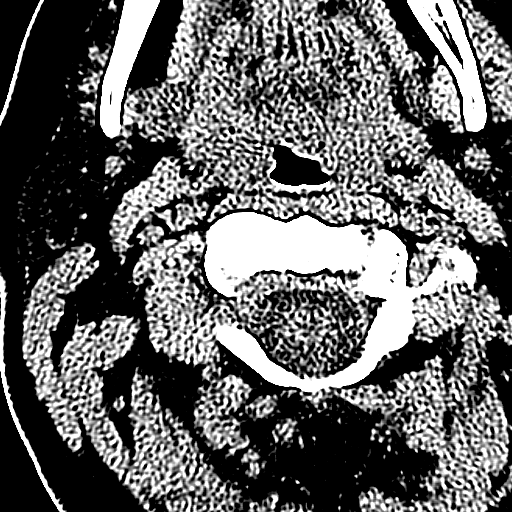
[im 73/81  brain]
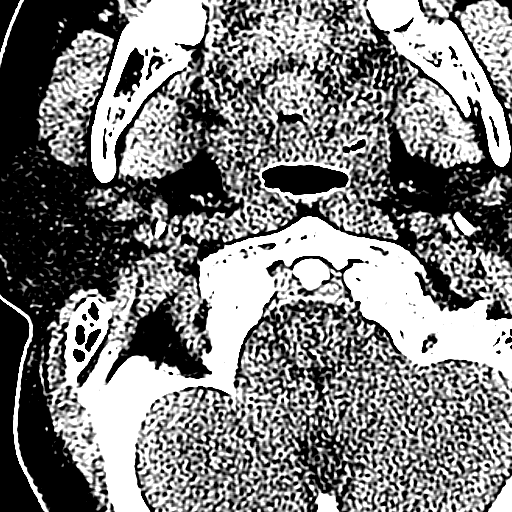
[im 73/81  bone]
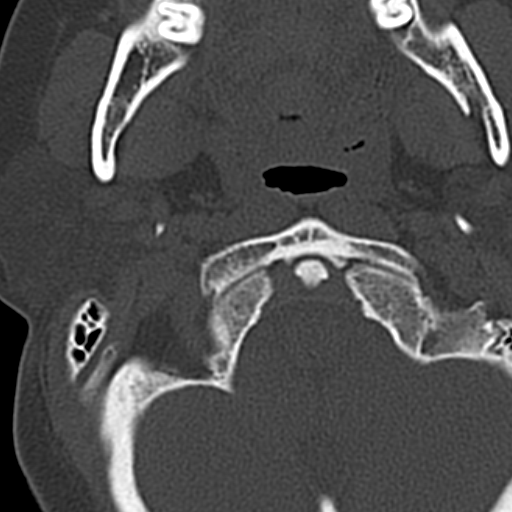

[16 of 30 positions shown; findings below may reference images not displayed]

FINDINGS: There is no evidence of acute infarction, mass lesion, or
intra- or extra-axial hemorrhage on CT.

The posterior fossa, including the cerebellum, brainstem and fourth
ventricle, is within normal limits.  The third and lateral
ventricles, and basal ganglia are unremarkable in appearance.  The
cerebral hemispheres are symmetric in appearance, with normal gray-
white differentiation.  No mass effect or midline shift is seen.

There is no evidence of fracture; visualized osseous structures are
unremarkable in appearance.  The orbits are within normal limits.
The paranasal sinuses and mastoid air cells are well-aerated.  No
significant soft tissue abnormalities are seen.
IMPRESSION: No evidence of traumatic intracranial injury or fracture.

CT CERVICAL SPINE
FINDINGS: There is no evidence of fracture or subluxation.
Vertebral bodies demonstrate normal height and alignment.
Intervertebral disc spaces are preserved.  Prevertebral soft
tissues are within normal limits.  The visualized neural foramina
are grossly unremarkable.

The thyroid gland is unremarkable in appearance.  The visualized
lung apices are clear.  No significant soft tissue abnormalities
are seen.
IMPRESSION: No evidence of fracture or subluxation along the cervical spine.

## 2017-07-13 ENCOUNTER — Emergency Department (HOSPITAL_COMMUNITY)
Admission: EM | Admit: 2017-07-13 | Discharge: 2017-07-14 | Disposition: A | Discharge status: Home / Self Care | Payer: Self-pay | Attending: Emergency Medicine | Admitting: Emergency Medicine

## 2017-07-13 ENCOUNTER — Encounter (HOSPITAL_COMMUNITY): Payer: Self-pay | Admitting: Emergency Medicine

## 2017-07-13 DIAGNOSIS — R0602 Shortness of breath: Secondary | ICD-10-CM | POA: Insufficient documentation

## 2017-07-13 DIAGNOSIS — K0889 Other specified disorders of teeth and supporting structures: Secondary | ICD-10-CM | POA: Insufficient documentation

## 2017-07-13 DIAGNOSIS — Z5321 Procedure and treatment not carried out due to patient leaving prior to being seen by health care provider: Secondary | ICD-10-CM | POA: Insufficient documentation

## 2017-07-13 DIAGNOSIS — H9202 Otalgia, left ear: Secondary | ICD-10-CM | POA: Insufficient documentation

## 2017-07-13 HISTORY — DX: Unspecified asthma, uncomplicated: J45.909

## 2017-07-13 NOTE — ED Triage Notes (Signed)
Pt reports having nonproductive cough along with pressure on left side of ear and dental pain. Pt also reports having multiple boils on legs.

## 2017-07-14 ENCOUNTER — Encounter (HOSPITAL_COMMUNITY): Payer: Self-pay | Admitting: *Deleted

## 2017-07-14 NOTE — ED Notes (Signed)
No answer when called for room x 3 

## 2020-06-29 ENCOUNTER — Other Ambulatory Visit: Payer: Self-pay

## 2020-06-29 ENCOUNTER — Emergency Department (HOSPITAL_COMMUNITY)
Admission: EM | Admit: 2020-06-29 | Discharge: 2020-06-30 | Disposition: A | Discharge status: Home / Self Care | Payer: Medicare HMO | Attending: Emergency Medicine | Admitting: Emergency Medicine

## 2020-06-29 ENCOUNTER — Emergency Department (HOSPITAL_COMMUNITY): Payer: Medicare HMO

## 2020-06-29 DIAGNOSIS — T424X1A Poisoning by benzodiazepines, accidental (unintentional), initial encounter: Secondary | ICD-10-CM | POA: Diagnosis not present

## 2020-06-29 DIAGNOSIS — F17210 Nicotine dependence, cigarettes, uncomplicated: Secondary | ICD-10-CM | POA: Diagnosis not present

## 2020-06-29 DIAGNOSIS — R000 Tachycardia, unspecified: Secondary | ICD-10-CM | POA: Insufficient documentation

## 2020-06-29 DIAGNOSIS — T50901A Poisoning by unspecified drugs, medicaments and biological substances, accidental (unintentional), initial encounter: Secondary | ICD-10-CM

## 2020-06-29 DIAGNOSIS — J45909 Unspecified asthma, uncomplicated: Secondary | ICD-10-CM | POA: Diagnosis not present

## 2020-06-29 DIAGNOSIS — T43621A Poisoning by amphetamines, accidental (unintentional), initial encounter: Secondary | ICD-10-CM | POA: Diagnosis not present

## 2020-06-29 LAB — CBC WITH DIFFERENTIAL/PLATELET
Abs Immature Granulocytes: 0.07 10*3/uL (ref 0.00–0.07)
Basophils Absolute: 0.1 10*3/uL (ref 0.0–0.1)
Basophils Relative: 1 %
Eosinophils Absolute: 0.2 10*3/uL (ref 0.0–0.5)
Eosinophils Relative: 2 %
HCT: 44 % (ref 36.0–46.0)
Hemoglobin: 15.6 g/dL — ABNORMAL HIGH (ref 12.0–15.0)
Immature Granulocytes: 1 %
Lymphocytes Relative: 31 %
Lymphs Abs: 3.4 10*3/uL (ref 0.7–4.0)
MCH: 30.8 pg (ref 26.0–34.0)
MCHC: 35.5 g/dL (ref 30.0–36.0)
MCV: 86.8 fL (ref 80.0–100.0)
Monocytes Absolute: 0.5 10*3/uL (ref 0.1–1.0)
Monocytes Relative: 5 %
Neutro Abs: 6.7 10*3/uL (ref 1.7–7.7)
Neutrophils Relative %: 60 %
Platelets: 377 10*3/uL (ref 150–400)
RBC: 5.07 MIL/uL (ref 3.87–5.11)
RDW: 13 % (ref 11.5–15.5)
WBC: 11 10*3/uL — ABNORMAL HIGH (ref 4.0–10.5)
nRBC: 0 % (ref 0.0–0.2)

## 2020-06-29 LAB — I-STAT BETA HCG BLOOD, ED (MC, WL, AP ONLY): I-stat hCG, quantitative: <5 m[IU]/mL (ref <5)

## 2020-06-29 MED ORDER — ONDANSETRON HCL 4 MG/2ML IJ SOLN
4.0000 mg | Freq: Once | INTRAMUSCULAR | Status: AC
  Administered 2020-06-30: 4 mg via INTRAVENOUS
  Filled 2020-06-29: qty 2
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
## 2020-06-30 DIAGNOSIS — T43621A Poisoning by amphetamines, accidental (unintentional), initial encounter: Secondary | ICD-10-CM | POA: Diagnosis not present

## 2020-06-30 LAB — COMPREHENSIVE METABOLIC PANEL
ALT: 19 U/L (ref 0–44)
AST: 17 U/L (ref 15–41)
Albumin: 3.7 g/dL (ref 3.5–5.0)
Alkaline Phosphatase: 49 U/L (ref 38–126)
Anion gap: 9 (ref 5–15)
BUN: 14 mg/dL (ref 6–20)
CO2: 22 mmol/L (ref 22–32)
Calcium: 9 mg/dL (ref 8.9–10.3)
Chloride: 103 mmol/L (ref 98–111)
Creatinine, Ser: 0.75 mg/dL (ref 0.44–1.00)
GFR calc Af Amer: >60 mL/min (ref >60)
GFR calc non Af Amer: >60 mL/min (ref >60)
Glucose, Bld: 145 mg/dL — ABNORMAL HIGH (ref 70–99)
Potassium: 3.5 mmol/L (ref 3.5–5.1)
Sodium: 134 mmol/L — ABNORMAL LOW (ref 135–145)
Total Bilirubin: 0.1 mg/dL — ABNORMAL LOW (ref 0.3–1.2)
Total Protein: 7 g/dL (ref 6.5–8.1)

## 2020-06-30 LAB — RAPID URINE DRUG SCREEN, HOSP PERFORMED
Amphetamines: POSITIVE — AB
Barbiturates: NOT DETECTED
Benzodiazepines: NOT DETECTED
Cocaine: NOT DETECTED
Opiates: NOT DETECTED
Tetrahydrocannabinol: NOT DETECTED

## 2020-06-30 LAB — ACETAMINOPHEN LEVEL: Acetaminophen (Tylenol), Serum: <10 ug/mL — ABNORMAL LOW (ref 10–30)

## 2020-06-30 LAB — SALICYLATE LEVEL: Salicylate Lvl: <7 mg/dL — ABNORMAL LOW (ref 7.0–30.0)

## 2020-06-30 LAB — ETHANOL: Alcohol, Ethyl (B): <10 mg/dL (ref <10)

## 2020-06-30 MED ORDER — ALBUTEROL SULFATE HFA 108 (90 BASE) MCG/ACT IN AERS
2.0000 | INHALATION_SPRAY | Freq: Once | RESPIRATORY_TRACT | Status: AC
  Administered 2020-06-30: 2 via RESPIRATORY_TRACT
  Filled 2020-06-30: qty 6.7

## 2020-06-30 NOTE — ED Notes (Signed)
Pt ambulated steady around the room

## 2021-10-03 IMAGING — DX DG CHEST 1V PORT
1 series · 1 of 1 positions shown · non-contrast
Comparison: None.

CLINICAL DATA: Altered mental status, possible overdose.

EXAM:
PORTABLE CHEST 1 VIEW

[chest ap]
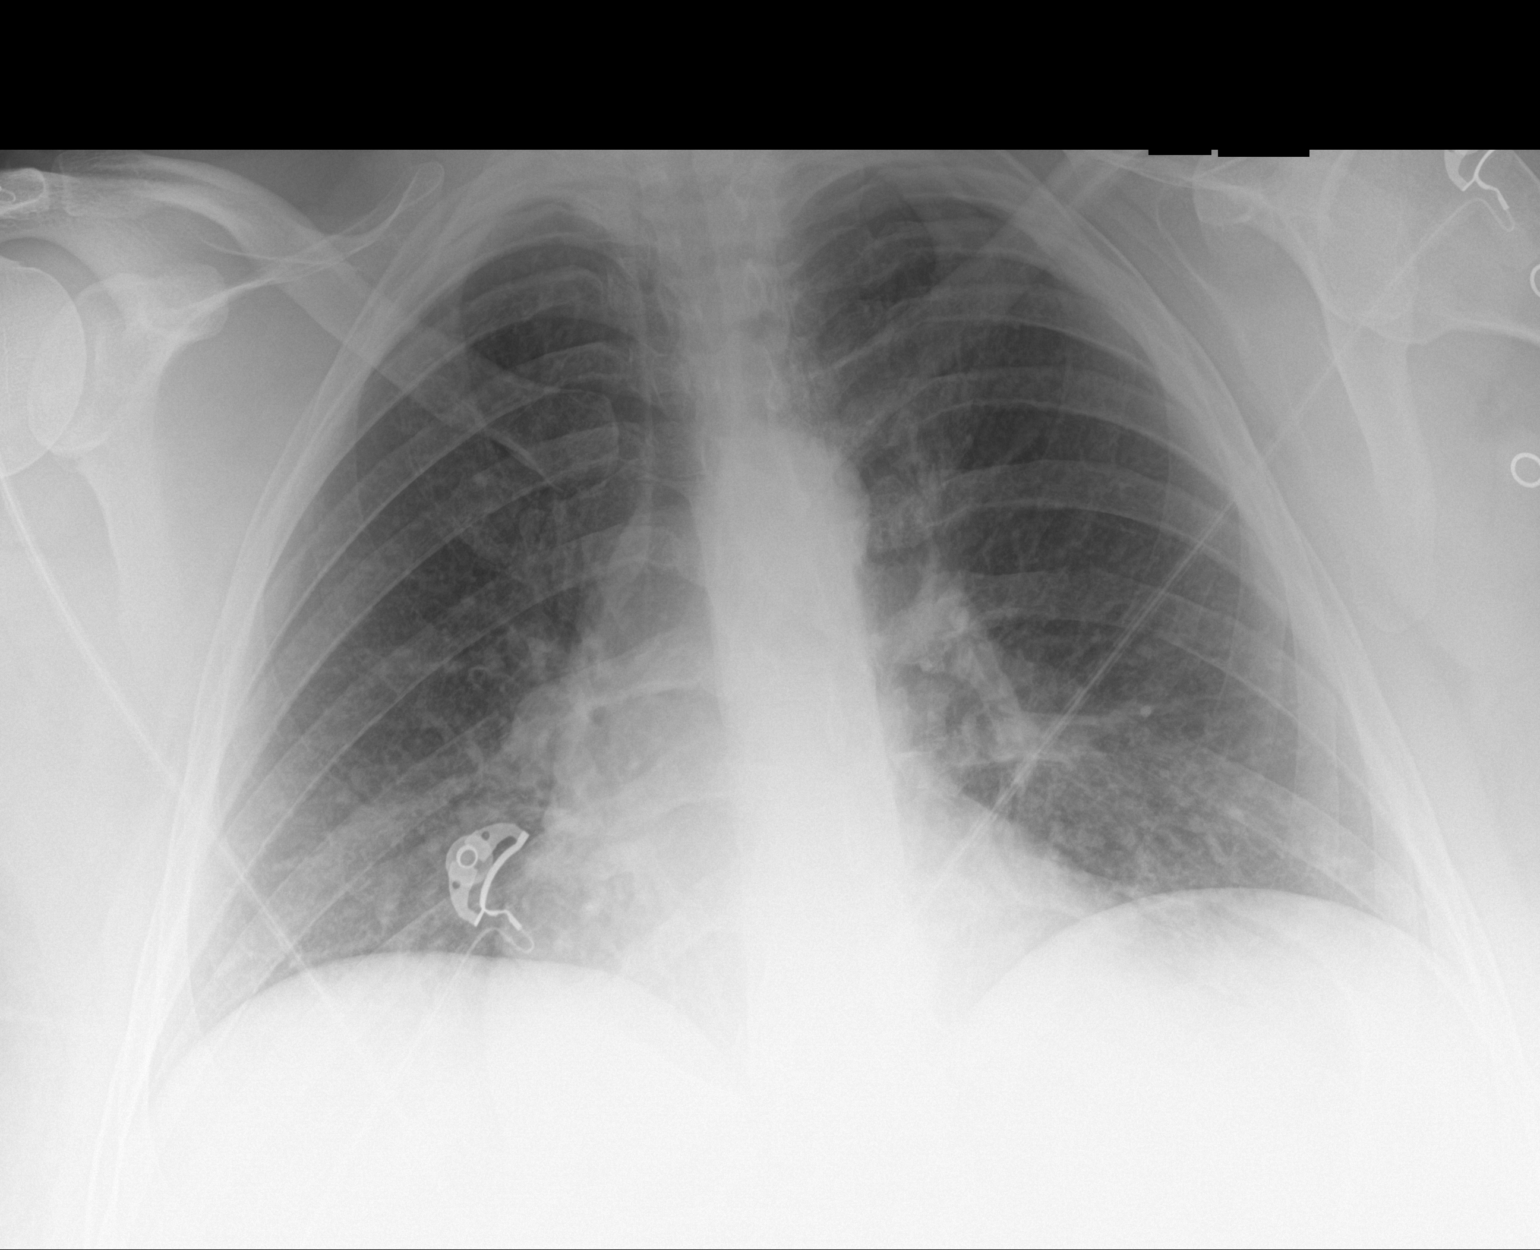

[1 of 1 positions shown; findings below may reference images not displayed]

FINDINGS: Mildly decreased lung volumes are noted, with mildly increased
bronchovascular lung markings seen within the bilateral lung bases.
There is no evidence of an acute infiltrate, pleural effusion or
pneumothorax. The heart size and mediastinal contours are within
normal limits. The visualized skeletal structures are unremarkable.
IMPRESSION: No acute cardiopulmonary disease.
